# Patient Record
Sex: Female | Born: 1985
Health system: Southern US, Community
[De-identification: ages and names within clinical notes are randomized; demographics above are authoritative.]

## PROBLEM LIST (undated history)

## (undated) DIAGNOSIS — Z789 Other specified health status: Secondary | ICD-10-CM

## (undated) HISTORY — PX: NO PAST SURGERIES: SHX2092

---
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
## 2008-07-16 ENCOUNTER — Emergency Department (HOSPITAL_COMMUNITY): Admission: EM | Admit: 2008-07-16 | Discharge: 2008-07-16 | Payer: Self-pay | Admitting: Family Medicine

## 2008-11-20 ENCOUNTER — Ambulatory Visit (HOSPITAL_COMMUNITY): Admission: RE | Admit: 2008-11-20 | Discharge: 2008-11-20 | Payer: Self-pay | Admitting: Obstetrics & Gynecology

## 2009-04-14 ENCOUNTER — Inpatient Hospital Stay (HOSPITAL_COMMUNITY): Admission: AD | Admit: 2009-04-14 | Discharge: 2009-04-16 | Payer: Self-pay | Admitting: Obstetrics & Gynecology

## 2009-08-05 IMAGING — CR DG CHEST 2V
2 series · 2 of 2 positions shown · non-contrast
Comparison: None.

CLINICAL DATA: Cough, shortness of breath, chest congestion.

CHEST 2 VIEWS 07/16/2008:

[view not recorded (1 of 2)]
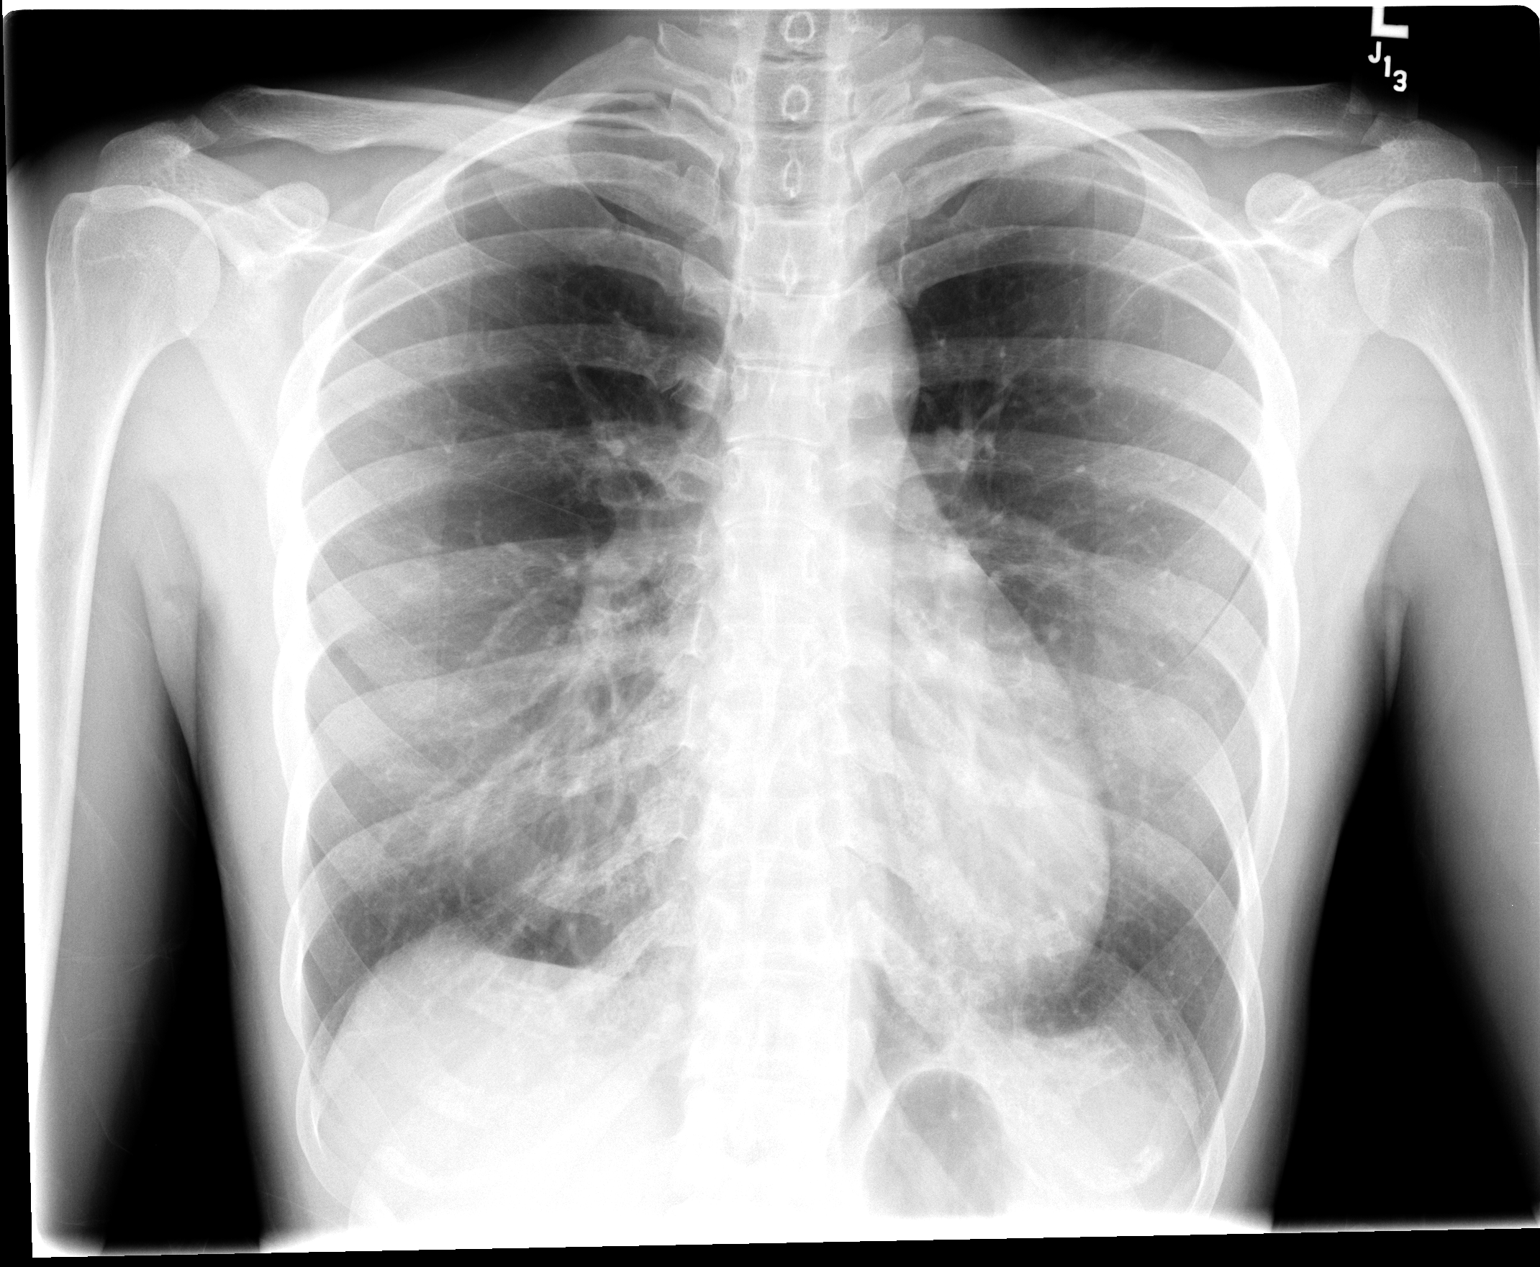

[view not recorded (2 of 2)]
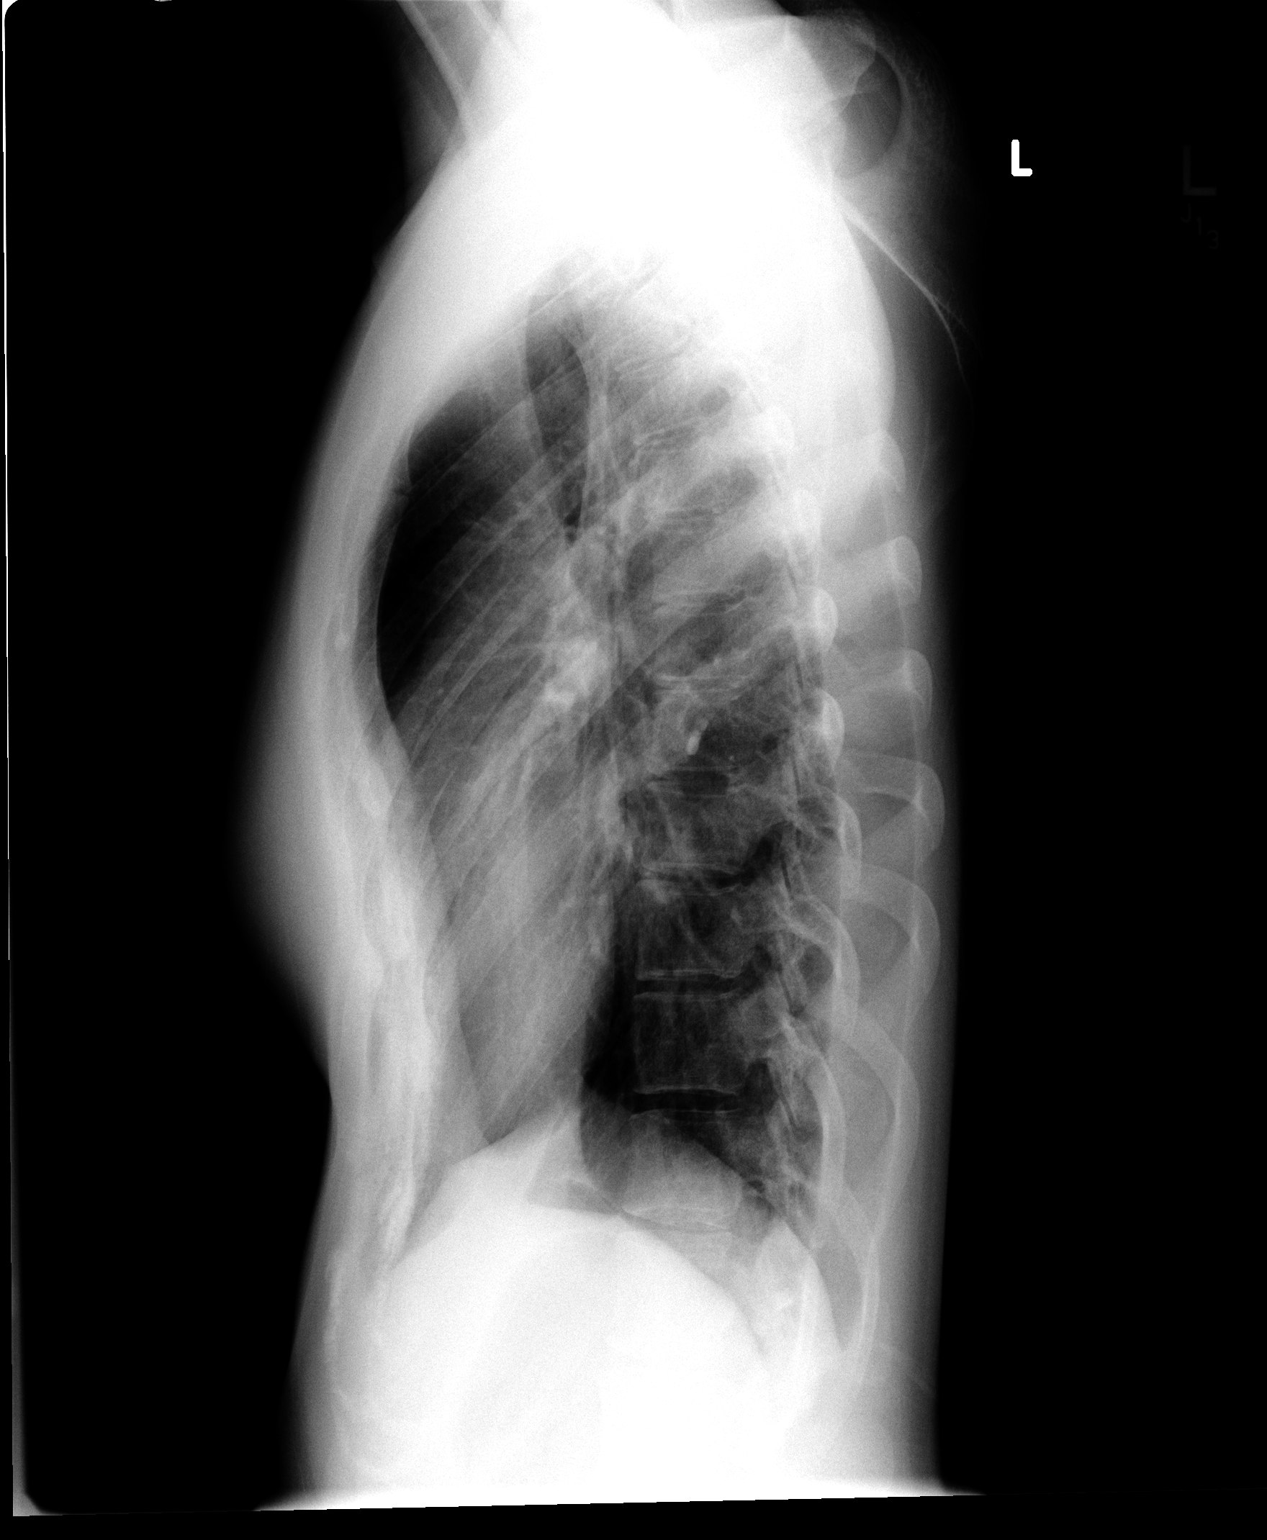

[2 of 2 positions shown; findings below may reference images not displayed]

FINDINGS: Cardiomediastinal silhouette unremarkable.  Lungs clear.
Bronchovascular markings normal.  No pleural effusions.  Marked
pectus excavatum sternal deformity.  Visualized bony thorax intact
otherwise.  Prominent costal cartilage calcifications noted.
IMPRESSION: No acute cardiopulmonary disease.  Marked pectus excavatum sternal
deformity.

## 2010-05-09 ENCOUNTER — Emergency Department (HOSPITAL_COMMUNITY): Admission: EM | Admit: 2010-05-09 | Discharge: 2010-05-09 | Payer: Self-pay | Admitting: Family Medicine

## 2010-10-09 LAB — HEMOGLOBIN AND HEMATOCRIT, BLOOD
HCT: 27.9 % — ABNORMAL LOW (ref 36.0–46.0)
Hemoglobin: 9.2 g/dL — ABNORMAL LOW (ref 12.0–15.0)

## 2010-10-09 LAB — CBC
HCT: 26.8 % — ABNORMAL LOW (ref 36.0–46.0)
Hemoglobin: 12.3 g/dL (ref 12.0–15.0)
Hemoglobin: 9 g/dL — ABNORMAL LOW (ref 12.0–15.0)
MCHC: 33.4 g/dL (ref 30.0–36.0)
MCV: 91.6 fL (ref 78.0–100.0)
Platelets: 149 10*3/uL — ABNORMAL LOW (ref 150–400)
Platelets: 167 10*3/uL (ref 150–400)
RDW: 12.5 % (ref 11.5–15.5)
RDW: 12.8 % (ref 11.5–15.5)
WBC: 11 10*3/uL — ABNORMAL HIGH (ref 4.0–10.5)

## 2010-10-09 LAB — RPR: RPR Ser Ql: NONREACTIVE

## 2010-10-20 LAB — POCT RAPID STREP A (OFFICE): Streptococcus, Group A Screen (Direct): POSITIVE — AB

## 2011-07-07 NOTE — L&D Delivery Note (Signed)
Delivery Note At 3:15 AM a viable female was delivered via Vaginal, Spontaneous Delivery (Presentation: LOA ;  ).  APGAR: 9, 9; weight 6 lb 12.8 oz (3085 g).   Placenta status: Intact, Spontaneous.  Cord: 3 vessels with the following complications: None.  Cord pH: none  Anesthesia: None  Episiotomy: None Lacerations: 1st degree;Vaginal Suture Repair: 2.0 chromic Est. Blood Loss (mL): 400  Mom to postpartum.  Baby to nursery-stable.  Shari Wilkinson A 02/07/2012, 3:38 AM

## 2011-09-22 LAB — OB RESULTS CONSOLE RUBELLA ANTIBODY, IGM: Rubella: IMMUNE

## 2011-09-22 LAB — OB RESULTS CONSOLE HEPATITIS B SURFACE ANTIGEN: Hepatitis B Surface Ag: NEGATIVE

## 2011-09-22 LAB — OB RESULTS CONSOLE ABO/RH: RH Type: POSITIVE

## 2011-09-22 LAB — OB RESULTS CONSOLE ANTIBODY SCREEN: Antibody Screen: NEGATIVE

## 2012-02-06 ENCOUNTER — Encounter (HOSPITAL_COMMUNITY): Payer: Self-pay | Admitting: Obstetrics and Gynecology

## 2012-02-06 ENCOUNTER — Inpatient Hospital Stay (HOSPITAL_COMMUNITY)
Admission: AD | Admit: 2012-02-06 | Discharge: 2012-02-08 | DRG: 775 | Disposition: A | Discharge status: Home / Self Care | Payer: Medicaid Other | Source: Ambulatory Visit | Attending: Obstetrics | Admitting: Obstetrics

## 2012-02-06 ENCOUNTER — Inpatient Hospital Stay (HOSPITAL_COMMUNITY): Payer: Medicaid Other

## 2012-02-06 DIAGNOSIS — O4290 Premature rupture of membranes, unspecified as to length of time between rupture and onset of labor, unspecified weeks of gestation: Principal | ICD-10-CM | POA: Diagnosis present

## 2012-02-06 HISTORY — DX: Other specified health status: Z78.9

## 2012-02-06 LAB — CBC
HCT: 34.9 % — ABNORMAL LOW (ref 36.0–46.0)
MCH: 28.9 pg (ref 26.0–34.0)
MCHC: 32.1 g/dL (ref 30.0–36.0)
MCV: 90.2 fL (ref 78.0–100.0)
Platelets: 154 10*3/uL (ref 150–400)
RDW: 13 % (ref 11.5–15.5)
WBC: 9.8 10*3/uL (ref 4.0–10.5)

## 2012-02-06 MED ORDER — OXYTOCIN 40 UNITS IN LACTATED RINGERS INFUSION - SIMPLE MED
62.5000 mL/h | Freq: Once | INTRAVENOUS | Status: DC
  Filled 2012-02-06: qty 1000

## 2012-02-06 MED ORDER — NALBUPHINE SYRINGE 5 MG/0.5 ML
10.0000 mg | INJECTION | INTRAMUSCULAR | Status: DC | PRN
  Administered 2012-02-07: 10 mg via INTRAVENOUS
  Filled 2012-02-06: qty 1

## 2012-02-06 MED ORDER — LACTATED RINGERS IV SOLN
INTRAVENOUS | Status: DC
  Administered 2012-02-06 (×2): via INTRAVENOUS

## 2012-02-06 MED ORDER — SODIUM CHLORIDE 0.9 % IV SOLN
2.0000 g | Freq: Four times a day (QID) | INTRAVENOUS | Status: DC
  Administered 2012-02-06 (×2): 2 g via INTRAVENOUS
  Filled 2012-02-06 (×4): qty 2000

## 2012-02-06 MED ORDER — LACTATED RINGERS IV SOLN
500.0000 mL | INTRAVENOUS | Status: DC | PRN
Start: 2012-02-06 — End: 2012-02-07

## 2012-02-06 MED ORDER — ACETAMINOPHEN 325 MG PO TABS: 650.0000 mg | ORAL_TABLET | ORAL | Status: DC | PRN

## 2012-02-06 MED ORDER — OXYTOCIN BOLUS FROM INFUSION
250.0000 mL | Freq: Once | INTRAVENOUS | Status: DC
  Filled 2012-02-06: qty 500

## 2012-02-06 MED ORDER — AZITHROMYCIN 500 MG PO TABS
500.0000 mg | ORAL_TABLET | Freq: Every day | ORAL | Status: AC
  Administered 2012-02-06: 500 mg via ORAL
  Filled 2012-02-06: qty 1

## 2012-02-06 MED ORDER — IBUPROFEN 600 MG PO TABS: 600.0000 mg | ORAL_TABLET | Freq: Four times a day (QID) | ORAL | Status: DC | PRN

## 2012-02-06 MED ORDER — ONDANSETRON HCL 4 MG/2ML IJ SOLN: 4.0000 mg | Freq: Four times a day (QID) | INTRAMUSCULAR | Status: DC | PRN

## 2012-02-06 MED ORDER — LIDOCAINE HCL (PF) 1 % IJ SOLN
30.0000 mL | INTRAMUSCULAR | Status: DC | PRN
  Administered 2012-02-07: 30 mL via SUBCUTANEOUS
  Filled 2012-02-06: qty 30

## 2012-02-06 MED ORDER — FLEET ENEMA 7-19 GM/118ML RE ENEM: 1.0000 | ENEMA | RECTAL | Status: DC | PRN

## 2012-02-06 MED ORDER — PROMETHAZINE HCL 25 MG/ML IJ SOLN: 25.0000 mg | Freq: Four times a day (QID) | INTRAMUSCULAR | Status: DC | PRN

## 2012-02-06 MED ORDER — OXYCODONE-ACETAMINOPHEN 5-325 MG PO TABS: 1.0000 | ORAL_TABLET | ORAL | Status: DC | PRN

## 2012-02-06 MED ORDER — AZITHROMYCIN 250 MG PO TABS
250.0000 mg | ORAL_TABLET | Freq: Every day | ORAL | Status: DC
  Filled 2012-02-06: qty 1

## 2012-02-06 MED ORDER — CITRIC ACID-SODIUM CITRATE 334-500 MG/5ML PO SOLN: 30.0000 mL | ORAL | Status: DC | PRN

## 2012-02-06 MED ORDER — NALBUPHINE SYRINGE 5 MG/0.5 ML: 10.0000 mg | INJECTION | Freq: Four times a day (QID) | INTRAMUSCULAR | Status: DC | PRN

## 2012-02-06 MED ORDER — PRENATAL MULTIVITAMIN CH
1.0000 | ORAL_TABLET | Freq: Every day | ORAL | Status: DC
  Administered 2012-02-06: 1 via ORAL
  Filled 2012-02-06: qty 1

## 2012-02-06 NOTE — Progress Notes (Signed)
Shari Wilkinson is Wilkinson 26 y.o. G3P1011 at [redacted]w[redacted]d by LMP admitted for rupture of membranes  Subjective:   Objective: BP 114/64  Pulse 73  Temp 98.2 F (36.8 C) (Oral)  Resp 18  Ht 5\' 5"  (1.651 m)  Wt 71.215 kg (157 lb)  BMI 26.13 kg/m2      FHT:  FHR: 150 bpm, variability: moderate,  accelerations:  Present,  decelerations:  Absent UC:   regular, every 3-5 minutes SVE:   Dilation: 6 Effacement (%): 90 Station: 0 Exam by:: [email or web address],RNC  Labs: Lab Results  Component Value Date   WBC 9.8 02/06/2012   HGB 11.2* 02/06/2012   HCT 34.9* 02/06/2012   MCV 90.2 02/06/2012   PLT 154 02/06/2012    Assessment / Plan: Spontaneous labor, progressing normally  Labor: Progressing normally Preeclampsia:  n/Wilkinson Fetal Wellbeing:  Category I Pain Control:  Nubain I/D:  n/Wilkinson Anticipated MOD:  NSVD  Shari Wilkinson 02/06/2012, 11:55 PM

## 2012-02-06 NOTE — MAU Note (Signed)
Pt presents to MAU with chief complaint of rupture of membranes. Pt is [redacted]w[redacted]d, says her water broke at 0430 this morning. Contractions are irregular

## 2012-02-06 NOTE — Progress Notes (Signed)
Dr. Clearance Coots notified of positive Amni Sure- plan to admit to L/D. Orders received.

## 2012-02-06 NOTE — H&P (Signed)
Shari Wilkinson is a 26 y.o. female presenting for leaking fluid. Maternal Medical History:  Reason for admission: Reason for admission: rupture of membranes.  26 yo G3 P1  EDC  03-22-12.  Presents with leaking fluid..  Contractions: Onset was 3-5 hours ago.   Frequency: irregular.    Fetal activity: Perceived fetal activity is normal.   Last perceived fetal movement was within the past hour.    Prenatal complications: no prenatal complications Prenatal Complications - Diabetes: none.    OB History    Grav Para Term Preterm Abortions TAB SAB Ect Mult Living   3 1 1  1 1    1      Past Medical History  Diagnosis Date  . No pertinent past medical history    Past Surgical History  Procedure Date  . No past surgeries    Family History: family history is not on file. Social History:  reports that she has never smoked. She does not have any smokeless tobacco history on file. She reports that she does not drink alcohol or use illicit drugs.   Prenatal Transfer Tool  Maternal Diabetes: No Genetic Screening: Normal Maternal Ultrasounds/Referrals: Normal Fetal Ultrasounds or other Referrals:  None Maternal Substance Abuse:  No Significant Maternal Medications:  Meds include: Other: see prenatal record Significant Maternal Lab Results:  Lab values include: Other: see prenatal record Other Comments:  None  Review of Systems  All other systems reviewed and are negative.      Blood pressure 114/64, pulse 73, temperature 98.2 F (36.8 C), temperature source Oral, resp. rate 18, height 5\' 5"  (1.651 m), weight 71.215 kg (157 lb). Maternal Exam:  Abdomen: Patient reports no abdominal tenderness. Introitus: Normal vulva. Normal vagina.    Physical Exam  Nursing note and vitals reviewed.   Prenatal labs: ABO, Rh:   Antibody:   Rubella:   RPR:    HBsAg:    HIV:    GBS:     Assessment/Plan: 33 weeks.  PPROM.  Admit.  Expectant management.   Dion Parrow A 02/06/2012, 9:46  PM

## 2012-02-07 ENCOUNTER — Encounter (HOSPITAL_COMMUNITY): Payer: Self-pay | Admitting: *Deleted

## 2012-02-07 MED ORDER — BENZOCAINE-MENTHOL 20-0.5 % EX AERO
1.0000 "application " | INHALATION_SPRAY | CUTANEOUS | Status: DC | PRN
  Filled 2012-02-07: qty 56

## 2012-02-07 MED ORDER — TETANUS-DIPHTH-ACELL PERTUSSIS 5-2.5-18.5 LF-MCG/0.5 IM SUSP: 0.5000 mL | Freq: Once | INTRAMUSCULAR | Status: DC

## 2012-02-07 MED ORDER — DIBUCAINE 1 % RE OINT
1.0000 "application " | TOPICAL_OINTMENT | RECTAL | Status: DC | PRN
  Filled 2012-02-07: qty 28

## 2012-02-07 MED ORDER — SENNOSIDES-DOCUSATE SODIUM 8.6-50 MG PO TABS
2.0000 | ORAL_TABLET | Freq: Every day | ORAL | Status: DC
  Administered 2012-02-07: 2 via ORAL

## 2012-02-07 MED ORDER — PRENATAL MULTIVITAMIN CH: 1.0000 | ORAL_TABLET | Freq: Every day | ORAL | Status: DC

## 2012-02-07 MED ORDER — IBUPROFEN 600 MG PO TABS: 600.0000 mg | ORAL_TABLET | Freq: Four times a day (QID) | ORAL | Status: DC

## 2012-02-07 MED ORDER — OXYCODONE-ACETAMINOPHEN 5-325 MG PO TABS: 1.0000 | ORAL_TABLET | ORAL | Status: DC | PRN

## 2012-02-07 MED ORDER — IBUPROFEN 600 MG PO TABS
600.0000 mg | ORAL_TABLET | Freq: Four times a day (QID) | ORAL | Status: DC
  Administered 2012-02-07 – 2012-02-08 (×6): 600 mg via ORAL
  Filled 2012-02-07 (×6): qty 1

## 2012-02-07 MED ORDER — ONDANSETRON HCL 4 MG/2ML IJ SOLN: 4.0000 mg | INTRAMUSCULAR | Status: DC | PRN

## 2012-02-07 MED ORDER — DIPHENHYDRAMINE HCL 25 MG PO CAPS: 25.0000 mg | ORAL_CAPSULE | Freq: Four times a day (QID) | ORAL | Status: DC | PRN

## 2012-02-07 MED ORDER — PRENATAL MULTIVITAMIN CH
1.0000 | ORAL_TABLET | Freq: Every day | ORAL | Status: DC
  Administered 2012-02-07 – 2012-02-08 (×2): 1 via ORAL
  Filled 2012-02-07 (×2): qty 1

## 2012-02-07 MED ORDER — BENZOCAINE-MENTHOL 20-0.5 % EX AERO
1.0000 "application " | INHALATION_SPRAY | CUTANEOUS | Status: DC | PRN
  Administered 2012-02-07: 1 via TOPICAL
  Filled 2012-02-07: qty 56

## 2012-02-07 MED ORDER — LANOLIN HYDROUS EX OINT: TOPICAL_OINTMENT | CUTANEOUS | Status: DC | PRN

## 2012-02-07 MED ORDER — OXYTOCIN 40 UNITS IN LACTATED RINGERS INFUSION - SIMPLE MED: 62.5000 mL/h | INTRAVENOUS | Status: DC | PRN

## 2012-02-07 MED ORDER — ZOLPIDEM TARTRATE 5 MG PO TABS: 5.0000 mg | ORAL_TABLET | Freq: Every evening | ORAL | Status: DC | PRN

## 2012-02-07 MED ORDER — WITCH HAZEL-GLYCERIN EX PADS: 1.0000 "application " | MEDICATED_PAD | CUTANEOUS | Status: DC | PRN

## 2012-02-07 MED ORDER — SENNOSIDES-DOCUSATE SODIUM 8.6-50 MG PO TABS: 2.0000 | ORAL_TABLET | Freq: Every day | ORAL | Status: DC

## 2012-02-07 MED ORDER — ONDANSETRON HCL 4 MG PO TABS: 4.0000 mg | ORAL_TABLET | ORAL | Status: DC | PRN

## 2012-02-07 MED ORDER — SIMETHICONE 80 MG PO CHEW: 80.0000 mg | CHEWABLE_TABLET | ORAL | Status: DC | PRN

## 2012-02-07 MED ORDER — DIBUCAINE 1 % RE OINT: 1.0000 "application " | TOPICAL_OINTMENT | RECTAL | Status: DC | PRN

## 2012-02-07 NOTE — Progress Notes (Signed)
Shari Wilkinson is a 26 y.o. G3P1011 at [redacted]w[redacted]d by LMP admitted for rupture of membranes  Subjective:   Objective: BP 112/69  Pulse 67  Temp 97.8 F (36.6 C) (Oral)  Resp 18  Ht 5\' 5"  (1.651 m)  Wt 71.215 kg (157 lb)  BMI 26.13 kg/m2      FHT:  FHR: 150 bpm, variability: moderate,  accelerations:  Present,  decelerations:  Absent UC:   regular, every 3-5 minutes SVE:   Dilation: 10 Effacement (%): 90 Station: 0 Exam by:: Beatriz Stallion, RN  Labs: Lab Results  Component Value Date   WBC 9.8 02/06/2012   HGB 11.2* 02/06/2012   HCT 34.9* 02/06/2012   MCV 90.2 02/06/2012   PLT 154 02/06/2012    Assessment / Plan: Spontaneous labor, progressing normally  Labor: Progressing normally Preeclampsia:  n/a Fetal Wellbeing:  Category I Pain Control:  Nubain I/D:  n/a Anticipated MOD:  NSVD  HARPER,CHARLES A 02/07/2012, 3:36 AM

## 2012-02-07 NOTE — Progress Notes (Signed)
Post Partum Day 0 Subjective: no complaints  Objective: Blood pressure 101/63, pulse 64, temperature 98.2 F (36.8 C), temperature source Oral, resp. rate 16, height 5\' 5"  (1.651 m), weight 71.215 kg (157 lb), SpO2 97.00%, unknown if currently breastfeeding.  Physical Exam:  General: alert and no distress Lochia: appropriate Uterine Fundus: firm Incision: healing well DVT Evaluation: No evidence of DVT seen on physical exam.   Basename 02/06/12 1610  HGB 11.2*  HCT 34.9*    Assessment/Plan: Doing well.  Routine.   LOS: 1 day   HARPER,CHARLES A 02/07/2012, 8:44 AM

## 2012-02-08 LAB — CBC
HCT: 33.2 % — ABNORMAL LOW (ref 36.0–46.0)
Hemoglobin: 10.8 g/dL — ABNORMAL LOW (ref 12.0–15.0)
MCH: 29.3 pg (ref 26.0–34.0)
MCHC: 32.5 g/dL (ref 30.0–36.0)
RBC: 3.68 MIL/uL — ABNORMAL LOW (ref 3.87–5.11)

## 2012-02-08 MED ORDER — IBUPROFEN 600 MG PO TABS: 600.0000 mg | ORAL_TABLET | Freq: Four times a day (QID) | ORAL | Status: AC

## 2012-02-08 MED ORDER — OXYCODONE-ACETAMINOPHEN 5-325 MG PO TABS: 1.0000 | ORAL_TABLET | ORAL | Status: AC | PRN

## 2012-02-08 NOTE — Discharge Summary (Signed)
Obstetric Discharge Summary Reason for Admission: rupture of membranes Prenatal Procedures: ultrasound Intrapartum Procedures: spontaneous vaginal delivery Postpartum Procedures: none Complications-Operative and Postpartum: none Hemoglobin  Date Value Range Status  02/08/2012 10.8* 12.0 - 15.0 g/dL Final     HCT  Date Value Range Status  02/08/2012 33.2* 36.0 - 46.0 % Final    Physical Exam:  General: alert and no distress Lochia: appropriate Uterine Fundus: firm Incision: healing well DVT Evaluation: No evidence of DVT seen on physical exam.  Discharge Diagnoses: PROM x8 hours.  NSVD.  Discharge Information: Date: 02/08/2012 Activity: pelvic rest Diet: routine Medications: PNV, Ibuprofen, Colace and Percocet Condition: stable Instructions: refer to practice specific booklet Discharge to: home Follow-up Information    Follow up with HARPER,CHARLES A, MD. Schedule an appointment as soon as possible for a visit in 6 weeks.   Contact information:   [address] [place] [address] [phone]          Newborn Data: Live born female  Birth Weight: 6 lb 12.8 oz (3085 g) APGAR: 9, 9  Home with mother.  HARPER,CHARLES A 02/08/2012, 8:46 AM

## 2012-02-08 NOTE — Progress Notes (Signed)
Post Partum Day 1 Subjective: no complaints  Objective: Blood pressure 103/64, pulse 66, temperature 98.2 F (36.8 C), temperature source Oral, resp. rate 18, height 5\' 5"  (1.651 m), weight 71.215 kg (157 lb), SpO2 98.00%, unknown if currently breastfeeding.  Physical Exam:  General: alert and no distress Lochia: appropriate Uterine Fundus: firm Incision: healing well DVT Evaluation: No evidence of DVT seen on physical exam.   Basename 02/08/12 0525 02/06/12 1610  HGB 10.8* 11.2*  HCT 33.2* 34.9*    Assessment/Plan: Discharge home   LOS: 2 days   Margrit Minner A 02/08/2012, 8:41 AM

## 2012-02-08 NOTE — Progress Notes (Signed)
UR chart review completed.  

## 2014-05-07 ENCOUNTER — Encounter (HOSPITAL_COMMUNITY): Payer: Self-pay | Admitting: *Deleted

## 2015-09-03 ENCOUNTER — Ambulatory Visit: Payer: Medicaid Other | Admitting: Obstetrics

## 2016-09-25 ENCOUNTER — Ambulatory Visit (INDEPENDENT_AMBULATORY_CARE_PROVIDER_SITE_OTHER): Payer: 59 | Admitting: Obstetrics

## 2016-09-25 ENCOUNTER — Encounter: Payer: Self-pay | Admitting: Obstetrics

## 2016-09-25 VITALS — BP 127/75 | HR 89 | Ht 65.0 in | Wt 129.0 lb

## 2016-09-25 DIAGNOSIS — Z30011 Encounter for initial prescription of contraceptive pills: Secondary | ICD-10-CM

## 2016-09-25 DIAGNOSIS — Z01419 Encounter for gynecological examination (general) (routine) without abnormal findings: Secondary | ICD-10-CM

## 2016-09-25 DIAGNOSIS — Z113 Encounter for screening for infections with a predominantly sexual mode of transmission: Secondary | ICD-10-CM

## 2016-09-25 DIAGNOSIS — Z0000 Encounter for general adult medical examination without abnormal findings: Secondary | ICD-10-CM

## 2016-09-25 DIAGNOSIS — Z124 Encounter for screening for malignant neoplasm of cervix: Secondary | ICD-10-CM

## 2016-09-25 DIAGNOSIS — Z3041 Encounter for surveillance of contraceptive pills: Secondary | ICD-10-CM

## 2016-09-25 MED ORDER — NORETHINDRONE-ETH ESTRADIOL 1-35 MG-MCG PO TABS: 1.0000 | ORAL_TABLET | Freq: Every day | ORAL | 11 refills | Status: DC
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
## 2016-09-25 NOTE — Progress Notes (Signed)
Pt presents for annual, STD testing, and pap. Pt c/o BCP Monessa causing hair to fall out. Pt wishes to continue BCP but with a different brand.

## 2016-09-26 LAB — HEPATITIS B SURFACE ANTIGEN: Hepatitis B Surface Ag: NEGATIVE

## 2016-09-26 LAB — RPR: RPR: NONREACTIVE

## 2016-09-26 LAB — HEPATITIS C ANTIBODY

## 2016-09-26 LAB — HIV ANTIBODY (ROUTINE TESTING W REFLEX): HIV SCREEN 4TH GENERATION: NONREACTIVE

## 2016-09-29 LAB — CERVICOVAGINAL ANCILLARY ONLY
BACTERIAL VAGINITIS: POSITIVE — AB
Candida vaginitis: NEGATIVE
Chlamydia: NEGATIVE
NEISSERIA GONORRHEA: NEGATIVE
Trichomonas: NEGATIVE

## 2016-09-30 ENCOUNTER — Encounter: Payer: Self-pay | Admitting: *Deleted

## 2016-09-30 ENCOUNTER — Other Ambulatory Visit: Payer: Self-pay | Admitting: Obstetrics

## 2016-09-30 DIAGNOSIS — B9689 Other specified bacterial agents as the cause of diseases classified elsewhere: Secondary | ICD-10-CM

## 2016-09-30 DIAGNOSIS — N760 Acute vaginitis: Principal | ICD-10-CM

## 2016-09-30 LAB — CYTOLOGY - PAP
DIAGNOSIS: NEGATIVE
HPV: NOT DETECTED

## 2016-09-30 MED ORDER — METRONIDAZOLE 500 MG PO TABS: 500.0000 mg | ORAL_TABLET | Freq: Two times a day (BID) | ORAL | 2 refills | Status: AC

## 2017-08-31 ENCOUNTER — Other Ambulatory Visit: Payer: Self-pay | Admitting: Obstetrics

## 2017-08-31 DIAGNOSIS — Z30011 Encounter for initial prescription of contraceptive pills: Secondary | ICD-10-CM

## 2022-11-27 ENCOUNTER — Encounter (HOSPITAL_COMMUNITY): Payer: Self-pay

## 2022-11-27 ENCOUNTER — Other Ambulatory Visit: Payer: Self-pay

## 2022-11-27 ENCOUNTER — Emergency Department (HOSPITAL_COMMUNITY)
Admission: EM | Admit: 2022-11-27 | Discharge: 2022-11-27 | Disposition: A | Discharge status: Home / Self Care | Payer: PRIVATE HEALTH INSURANCE | Attending: Emergency Medicine | Admitting: Emergency Medicine

## 2022-11-27 DIAGNOSIS — T148XXA Other injury of unspecified body region, initial encounter: Secondary | ICD-10-CM

## 2022-11-27 DIAGNOSIS — S60414A Abrasion of right ring finger, initial encounter: Secondary | ICD-10-CM | POA: Diagnosis not present

## 2022-11-27 DIAGNOSIS — S161XXA Strain of muscle, fascia and tendon at neck level, initial encounter: Secondary | ICD-10-CM | POA: Insufficient documentation

## 2022-11-27 DIAGNOSIS — S0083XA Contusion of other part of head, initial encounter: Secondary | ICD-10-CM

## 2022-11-27 DIAGNOSIS — M542 Cervicalgia: Secondary | ICD-10-CM | POA: Diagnosis present

## 2022-11-27 MED ORDER — CYCLOBENZAPRINE HCL 10 MG PO TABS: 10.0000 mg | ORAL_TABLET | Freq: Two times a day (BID) | ORAL | 0 refills | Status: DC | PRN

## 2022-11-27 MED ORDER — KETOROLAC TROMETHAMINE 15 MG/ML IJ SOLN
15.0000 mg | Freq: Once | INTRAMUSCULAR | Status: AC
  Administered 2022-11-27: 15 mg via INTRAMUSCULAR
  Filled 2022-11-27: qty 1

## 2022-11-27 MED ORDER — CYCLOBENZAPRINE HCL 10 MG PO TABS
5.0000 mg | ORAL_TABLET | Freq: Once | ORAL | Status: AC
  Administered 2022-11-27: 5 mg via ORAL
  Filled 2022-11-27: qty 1

## 2022-11-27 MED ORDER — CYCLOBENZAPRINE HCL 10 MG PO TABS: 10.0000 mg | ORAL_TABLET | Freq: Two times a day (BID) | ORAL | 0 refills | Status: AC | PRN
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
## 2022-12-01 ENCOUNTER — Ambulatory Visit: Payer: Self-pay

## 2022-12-01 ENCOUNTER — Other Ambulatory Visit: Payer: Self-pay | Admitting: Nurse Practitioner

## 2022-12-01 DIAGNOSIS — M542 Cervicalgia: Secondary | ICD-10-CM

## 2022-12-15 ENCOUNTER — Other Ambulatory Visit: Payer: Self-pay | Admitting: Nurse Practitioner

## 2022-12-15 ENCOUNTER — Ambulatory Visit: Payer: Self-pay

## 2022-12-15 DIAGNOSIS — M79641 Pain in right hand: Secondary | ICD-10-CM

## 2023-01-13 ENCOUNTER — Other Ambulatory Visit (HOSPITAL_BASED_OUTPATIENT_CLINIC_OR_DEPARTMENT_OTHER): Payer: Self-pay | Admitting: Orthopedic Surgery

## 2023-01-13 DIAGNOSIS — M542 Cervicalgia: Secondary | ICD-10-CM

## 2023-01-17 ENCOUNTER — Ambulatory Visit (HOSPITAL_BASED_OUTPATIENT_CLINIC_OR_DEPARTMENT_OTHER)
Admission: RE | Admit: 2023-01-17 | Discharge: 2023-01-17 | Disposition: A | Discharge status: Home / Self Care | Payer: PRIVATE HEALTH INSURANCE | Source: Ambulatory Visit | Attending: Orthopedic Surgery | Admitting: Orthopedic Surgery

## 2023-01-17 DIAGNOSIS — M542 Cervicalgia: Secondary | ICD-10-CM | POA: Diagnosis present
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
## 2023-02-10 ENCOUNTER — Encounter: Payer: Self-pay | Admitting: Physical Therapy

## 2023-02-10 ENCOUNTER — Ambulatory Visit: Payer: PRIVATE HEALTH INSURANCE | Attending: Orthopaedic Surgery | Admitting: Physical Therapy

## 2023-02-10 ENCOUNTER — Other Ambulatory Visit: Payer: Self-pay

## 2023-02-10 DIAGNOSIS — M62838 Other muscle spasm: Secondary | ICD-10-CM | POA: Diagnosis present

## 2023-02-10 DIAGNOSIS — M6281 Muscle weakness (generalized): Secondary | ICD-10-CM

## 2023-02-10 DIAGNOSIS — R293 Abnormal posture: Secondary | ICD-10-CM

## 2023-02-10 DIAGNOSIS — M542 Cervicalgia: Secondary | ICD-10-CM

## 2023-02-10 DIAGNOSIS — M5412 Radiculopathy, cervical region: Secondary | ICD-10-CM

## 2023-02-16 ENCOUNTER — Encounter: Payer: Self-pay | Admitting: Physical Therapy

## 2023-02-16 ENCOUNTER — Ambulatory Visit: Payer: PRIVATE HEALTH INSURANCE | Admitting: Physical Therapy

## 2023-02-16 DIAGNOSIS — M6281 Muscle weakness (generalized): Secondary | ICD-10-CM

## 2023-02-16 DIAGNOSIS — M62838 Other muscle spasm: Secondary | ICD-10-CM

## 2023-02-16 DIAGNOSIS — M542 Cervicalgia: Secondary | ICD-10-CM

## 2023-02-16 DIAGNOSIS — R293 Abnormal posture: Secondary | ICD-10-CM

## 2023-02-16 DIAGNOSIS — M5412 Radiculopathy, cervical region: Secondary | ICD-10-CM
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
## 2023-02-18 ENCOUNTER — Encounter: Payer: Self-pay | Admitting: Physical Therapy

## 2023-02-18 ENCOUNTER — Ambulatory Visit: Payer: PRIVATE HEALTH INSURANCE | Admitting: Physical Therapy

## 2023-02-18 DIAGNOSIS — M62838 Other muscle spasm: Secondary | ICD-10-CM

## 2023-02-18 DIAGNOSIS — M542 Cervicalgia: Secondary | ICD-10-CM | POA: Diagnosis not present

## 2023-02-18 DIAGNOSIS — R293 Abnormal posture: Secondary | ICD-10-CM

## 2023-02-18 DIAGNOSIS — M6281 Muscle weakness (generalized): Secondary | ICD-10-CM

## 2023-02-18 DIAGNOSIS — M5412 Radiculopathy, cervical region: Secondary | ICD-10-CM
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
## 2023-03-10 ENCOUNTER — Encounter: Payer: Self-pay | Admitting: Physical Therapy

## 2023-03-10 ENCOUNTER — Ambulatory Visit: Payer: PRIVATE HEALTH INSURANCE | Attending: Orthopaedic Surgery | Admitting: Physical Therapy

## 2023-03-10 DIAGNOSIS — M62838 Other muscle spasm: Secondary | ICD-10-CM | POA: Diagnosis present

## 2023-03-10 DIAGNOSIS — R293 Abnormal posture: Secondary | ICD-10-CM | POA: Diagnosis present

## 2023-03-10 DIAGNOSIS — M5412 Radiculopathy, cervical region: Secondary | ICD-10-CM | POA: Diagnosis present

## 2023-03-10 DIAGNOSIS — M6281 Muscle weakness (generalized): Secondary | ICD-10-CM | POA: Insufficient documentation

## 2023-03-10 DIAGNOSIS — M542 Cervicalgia: Secondary | ICD-10-CM | POA: Insufficient documentation
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
## 2023-03-12 ENCOUNTER — Encounter: Payer: Commercial Managed Care - PPO | Admitting: Physical Therapy

## 2023-03-15 ENCOUNTER — Ambulatory Visit: Payer: PRIVATE HEALTH INSURANCE

## 2023-03-15 DIAGNOSIS — M5412 Radiculopathy, cervical region: Secondary | ICD-10-CM

## 2023-03-15 DIAGNOSIS — R293 Abnormal posture: Secondary | ICD-10-CM

## 2023-03-15 DIAGNOSIS — M542 Cervicalgia: Secondary | ICD-10-CM

## 2023-03-15 DIAGNOSIS — M62838 Other muscle spasm: Secondary | ICD-10-CM

## 2023-03-15 DIAGNOSIS — M6281 Muscle weakness (generalized): Secondary | ICD-10-CM
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
## 2023-03-17 ENCOUNTER — Encounter: Payer: Self-pay | Admitting: Physical Therapy

## 2023-03-17 ENCOUNTER — Ambulatory Visit: Payer: PRIVATE HEALTH INSURANCE | Admitting: Physical Therapy

## 2023-03-17 DIAGNOSIS — M6281 Muscle weakness (generalized): Secondary | ICD-10-CM

## 2023-03-17 DIAGNOSIS — M542 Cervicalgia: Secondary | ICD-10-CM | POA: Diagnosis not present

## 2023-03-17 DIAGNOSIS — M62838 Other muscle spasm: Secondary | ICD-10-CM

## 2023-03-17 DIAGNOSIS — R293 Abnormal posture: Secondary | ICD-10-CM

## 2023-03-17 DIAGNOSIS — M5412 Radiculopathy, cervical region: Secondary | ICD-10-CM

## 2023-03-23 ENCOUNTER — Encounter: Payer: Self-pay | Admitting: Physical Therapy

## 2023-03-23 ENCOUNTER — Ambulatory Visit: Payer: PRIVATE HEALTH INSURANCE | Admitting: Physical Therapy

## 2023-03-23 DIAGNOSIS — R293 Abnormal posture: Secondary | ICD-10-CM

## 2023-03-23 DIAGNOSIS — M542 Cervicalgia: Secondary | ICD-10-CM

## 2023-03-23 DIAGNOSIS — M5412 Radiculopathy, cervical region: Secondary | ICD-10-CM

## 2023-03-23 DIAGNOSIS — M6281 Muscle weakness (generalized): Secondary | ICD-10-CM

## 2023-03-23 DIAGNOSIS — M62838 Other muscle spasm: Secondary | ICD-10-CM
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
## 2023-03-25 ENCOUNTER — Ambulatory Visit: Payer: PRIVATE HEALTH INSURANCE | Admitting: Physical Therapy

## 2023-03-25 ENCOUNTER — Encounter: Payer: Self-pay | Admitting: Physical Therapy

## 2023-03-25 DIAGNOSIS — M5412 Radiculopathy, cervical region: Secondary | ICD-10-CM

## 2023-03-25 DIAGNOSIS — M542 Cervicalgia: Secondary | ICD-10-CM

## 2023-03-25 DIAGNOSIS — R293 Abnormal posture: Secondary | ICD-10-CM

## 2023-03-25 DIAGNOSIS — M6281 Muscle weakness (generalized): Secondary | ICD-10-CM

## 2023-03-25 DIAGNOSIS — M62838 Other muscle spasm: Secondary | ICD-10-CM
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
## 2023-03-30 ENCOUNTER — Ambulatory Visit: Payer: PRIVATE HEALTH INSURANCE

## 2023-04-01 ENCOUNTER — Ambulatory Visit: Payer: PRIVATE HEALTH INSURANCE | Attending: Orthopaedic Surgery | Admitting: Physical Therapy

## 2023-04-01 ENCOUNTER — Encounter: Payer: Self-pay | Admitting: Physical Therapy

## 2023-04-01 DIAGNOSIS — M62838 Other muscle spasm: Secondary | ICD-10-CM | POA: Diagnosis present

## 2023-04-01 DIAGNOSIS — M6281 Muscle weakness (generalized): Secondary | ICD-10-CM | POA: Diagnosis present

## 2023-04-01 DIAGNOSIS — M5412 Radiculopathy, cervical region: Secondary | ICD-10-CM | POA: Insufficient documentation

## 2023-04-01 DIAGNOSIS — M542 Cervicalgia: Secondary | ICD-10-CM | POA: Insufficient documentation

## 2023-04-01 DIAGNOSIS — R293 Abnormal posture: Secondary | ICD-10-CM | POA: Insufficient documentation
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
## 2024-02-14 ENCOUNTER — Other Ambulatory Visit: Payer: Self-pay | Admitting: Medical Genetics

## 2024-03-02 ENCOUNTER — Other Ambulatory Visit

## 2024-03-02 DIAGNOSIS — Z006 Encounter for examination for normal comparison and control in clinical research program: Secondary | ICD-10-CM

## 2024-03-10 LAB — GENECONNECT MOLECULAR SCREEN: Genetic Analysis Overall Interpretation: NEGATIVE

## 2024-05-22 ENCOUNTER — Telehealth: Payer: Self-pay | Admitting: Family Medicine

## 2024-05-22 ENCOUNTER — Telehealth: Payer: Self-pay

## 2024-05-22 DIAGNOSIS — O3680X0 Pregnancy with inconclusive fetal viability, not applicable or unspecified: Secondary | ICD-10-CM

## 2024-05-22 NOTE — Addendum Note (Signed)
 Addended by: ELBY WADDELL CROME on: 05/22/2024 01:46 PM   Modules accepted: Orders

## 2024-05-22 NOTE — Telephone Encounter (Signed)
 Patient is returning call from nurse Waddell. I let the patient know that we received a referral for her to have an US  done in this office. I offered patient an appointment for next Monday. Patient declined because she says her insurance is with [facility] and she thinks it will be better for her to schedule with them so that she does not have to pay anything. Patient says she will call back to get scheduled after she calls her insurance if she is able to be seen here with no cost.

## 2024-05-22 NOTE — Telephone Encounter (Signed)
 Attempted to contact pt to schedule viability US .  Left voicemail with office callback number.    Waddell, RN

## 2024-05-22 NOTE — Telephone Encounter (Signed)
 MaryAnn, a nurse at [facility], left voicemail as a referral for this pt she identified by name and DOB.  Pt had an US  on 05/11/24 at [facility], gestation measured 6 weeks but no FHR visualized.  She had a repeat US  8 days later on 05/19/24 with minimal growth, measuring 6 weeks 2 days with no FHR visualized.  The nurse mentioned there was a questionable area on transverse view, unsure if second gestation.    Waddell, RN

## 2024-05-24 ENCOUNTER — Encounter: Payer: Self-pay | Admitting: *Deleted

## 2024-05-24 NOTE — Telephone Encounter (Signed)
 I called patient and left a message we are calling re: referral and to call our office. Since this is second time we have called I will send a letter and a MyChart message. Shari Wilkinson
